# Patient Record
Sex: Male | Born: 1955 | Race: White | Hispanic: No | Marital: Married | Smoking: Current every day smoker
Health system: Southern US, Community
[De-identification: ages and names within clinical notes are randomized; demographics above are authoritative.]

## PROBLEM LIST (undated history)

## (undated) DIAGNOSIS — E119 Type 2 diabetes mellitus without complications: Secondary | ICD-10-CM

## (undated) DIAGNOSIS — I10 Essential (primary) hypertension: Secondary | ICD-10-CM

## (undated) DIAGNOSIS — I4891 Unspecified atrial fibrillation: Secondary | ICD-10-CM

## (undated) DIAGNOSIS — M1990 Unspecified osteoarthritis, unspecified site: Secondary | ICD-10-CM

## (undated) HISTORY — PX: APPENDECTOMY: SHX54

---

## 1997-05-06 DIAGNOSIS — I4891 Unspecified atrial fibrillation: Secondary | ICD-10-CM

## 1997-05-06 HISTORY — DX: Unspecified atrial fibrillation: I48.91

## 2002-05-06 HISTORY — PX: TIBIA FRACTURE SURGERY: SHX806

## 2002-05-06 HISTORY — PX: HIP FRACTURE SURGERY: SHX118

## 2011-05-07 HISTORY — PX: OTHER SURGICAL HISTORY: SHX169

## 2013-10-06 ENCOUNTER — Other Ambulatory Visit: Payer: Self-pay | Admitting: Podiatry

## 2013-10-07 NOTE — Addendum Note (Signed)
Addended by: Ferman Hamming on: 10/07/2013 09:08 AM   Modules accepted: Orders

## 2013-10-18 ENCOUNTER — Encounter (HOSPITAL_COMMUNITY): Payer: Self-pay

## 2013-10-19 NOTE — Patient Instructions (Addendum)
Willie Glover  10/19/2013   Your procedure is scheduled on:  10/28/13  [facility] to [facility] at 06:15 AM.  Call this number if you have problems the morning of surgery: [phone]   Remember:   Do not eat food or drink liquids after midnight.   Take these medicines the morning of surgery with A SIP OF WATER: Lisinopril, Digoxin, Carvedilol and Gabapentin. You may take your Hydrocodone if needed.   Stop Coumadin on 10/21/2013  Start  Aspirin 81 mg as soon as you stop the  Coumadin.  Start back on Coumadin the day after surgery.   Do not wear jewelry, make-up or nail polish.  Do not wear lotions, powders, or perfumes. You may wear deodorant.  Do not shave 48 hours prior to surgery. Men may shave face and neck.  Do not bring valuables to the hospital.  [facility] not [facility] belongings or valuables.               Contacts, dentures or bridgework may not be worn into surgery.  Leave suitcase in the car. After surgery it may be brought to your room.  For patients admitted to the hospital, discharge time is determined by your treatment team.               Patients discharged the day of surgery will not be allowed to drive home.   Special Instructions: Shower using CHG 1 nights before surgery and the morning of surgery.    Please read over the following fact sheets that you were given: Anesthesia Post-op Instructions    PATIENT INSTRUCTIONS POST-ANESTHESIA  IMMEDIATELY FOLLOWING SURGERY:  Do not drive or operate machinery for the first twenty four hours after surgery.  Do not make any important decisions for twenty four hours after surgery or while taking narcotic pain medications or sedatives.  If you develop intractable nausea and vomiting or a severe headache please notify your doctor immediately.  FOLLOW-UP:  Please make an appointment with your surgeon as instructed. You do not need to follow up with anesthesia unless specifically instructed to do so.  WOUND CARE  INSTRUCTIONS (if applicable):  Keep a dry clean dressing on the anesthesia/puncture wound site if there is drainage.  Once the wound has quit draining you may leave it open to air.  Generally you should leave the bandage intact for twenty four hours unless there is drainage.  If the epidural site drains for more than 36-48 hours please call the anesthesia department.  QUESTIONS?:  Please feel free to call your physician or the hospital operator if you have any questions, and they will be happy to assist you.

## 2013-10-20 ENCOUNTER — Ambulatory Visit (HOSPITAL_COMMUNITY)
Admission: RE | Admit: 2013-10-20 | Discharge: 2013-10-20 | Disposition: A | Discharge status: Home / Self Care | Payer: Medicare Other | Source: Ambulatory Visit | Attending: Podiatry | Admitting: Podiatry

## 2013-10-20 ENCOUNTER — Encounter (HOSPITAL_COMMUNITY): Payer: Self-pay

## 2013-10-20 ENCOUNTER — Other Ambulatory Visit: Payer: Self-pay

## 2013-10-20 ENCOUNTER — Encounter (HOSPITAL_COMMUNITY)
Admission: RE | Admit: 2013-10-20 | Discharge: 2013-10-20 | Disposition: A | Discharge status: Home / Self Care | Payer: Medicare Other | Source: Ambulatory Visit | Attending: Podiatry | Admitting: Podiatry

## 2013-10-20 DIAGNOSIS — M2010 Hallux valgus (acquired), unspecified foot: Secondary | ICD-10-CM | POA: Insufficient documentation

## 2013-10-20 DIAGNOSIS — M7989 Other specified soft tissue disorders: Secondary | ICD-10-CM | POA: Insufficient documentation

## 2013-10-20 HISTORY — DX: Unspecified osteoarthritis, unspecified site: M19.90

## 2013-10-20 HISTORY — DX: Unspecified atrial fibrillation: I48.91

## 2013-10-20 HISTORY — DX: Essential (primary) hypertension: I10

## 2013-10-20 HISTORY — DX: Type 2 diabetes mellitus without complications: E11.9

## 2013-10-20 LAB — BASIC METABOLIC PANEL
BUN: 9 mg/dL (ref 6–23)
CHLORIDE: 104 meq/L (ref 96–112)
CO2: 25 meq/L (ref 19–32)
Calcium: 10 mg/dL (ref 8.4–10.5)
Creatinine, Ser: 0.75 mg/dL (ref 0.50–1.35)
GFR calc Af Amer: >90 mL/min (ref >90)
GFR calc non Af Amer: >90 mL/min (ref >90)
Glucose, Bld: 104 mg/dL — ABNORMAL HIGH (ref 70–99)
POTASSIUM: 4.9 meq/L (ref 3.7–5.3)
SODIUM: 141 meq/L (ref 137–147)

## 2013-10-20 LAB — CBC
HCT: 46.8 % (ref 39.0–52.0)
Hemoglobin: 16.3 g/dL (ref 13.0–17.0)
MCH: 34.2 pg — ABNORMAL HIGH (ref 26.0–34.0)
MCHC: 34.8 g/dL (ref 30.0–36.0)
MCV: 98.3 fL (ref 78.0–100.0)
PLATELETS: 222 10*3/uL (ref 150–400)
RBC: 4.76 MIL/uL (ref 4.22–5.81)
RDW: 14.1 % (ref 11.5–15.5)
WBC: 5.4 10*3/uL (ref 4.0–10.5)

## 2013-10-28 ENCOUNTER — Ambulatory Visit (HOSPITAL_COMMUNITY)
Admission: RE | Admit: 2013-10-28 | Discharge: 2013-10-28 | Disposition: A | Discharge status: Home / Self Care | Payer: Medicare Other | Source: Ambulatory Visit | Attending: Podiatry | Admitting: Podiatry

## 2013-10-28 ENCOUNTER — Ambulatory Visit (HOSPITAL_COMMUNITY): Payer: Medicare Other

## 2013-10-28 ENCOUNTER — Encounter (HOSPITAL_COMMUNITY)
Admission: RE | Disposition: A | Discharge status: Home / Self Care | Payer: Self-pay | Source: Ambulatory Visit | Attending: Podiatry

## 2013-10-28 ENCOUNTER — Ambulatory Visit (HOSPITAL_COMMUNITY): Payer: Medicare Other | Admitting: Anesthesiology

## 2013-10-28 ENCOUNTER — Encounter (HOSPITAL_COMMUNITY): Payer: Self-pay | Admitting: *Deleted

## 2013-10-28 ENCOUNTER — Encounter (HOSPITAL_COMMUNITY): Payer: Medicare Other | Admitting: Anesthesiology

## 2013-10-28 DIAGNOSIS — F17200 Nicotine dependence, unspecified, uncomplicated: Secondary | ICD-10-CM | POA: Insufficient documentation

## 2013-10-28 DIAGNOSIS — Z79899 Other long term (current) drug therapy: Secondary | ICD-10-CM | POA: Insufficient documentation

## 2013-10-28 DIAGNOSIS — I4891 Unspecified atrial fibrillation: Secondary | ICD-10-CM | POA: Insufficient documentation

## 2013-10-28 DIAGNOSIS — L97509 Non-pressure chronic ulcer of other part of unspecified foot with unspecified severity: Secondary | ICD-10-CM | POA: Insufficient documentation

## 2013-10-28 DIAGNOSIS — E1149 Type 2 diabetes mellitus with other diabetic neurological complication: Secondary | ICD-10-CM | POA: Insufficient documentation

## 2013-10-28 DIAGNOSIS — I10 Essential (primary) hypertension: Secondary | ICD-10-CM | POA: Insufficient documentation

## 2013-10-28 DIAGNOSIS — M86179 Other acute osteomyelitis, unspecified ankle and foot: Secondary | ICD-10-CM | POA: Insufficient documentation

## 2013-10-28 DIAGNOSIS — L97522 Non-pressure chronic ulcer of other part of left foot with fat layer exposed: Secondary | ICD-10-CM

## 2013-10-28 DIAGNOSIS — Z7901 Long term (current) use of anticoagulants: Secondary | ICD-10-CM | POA: Insufficient documentation

## 2013-10-28 DIAGNOSIS — E1142 Type 2 diabetes mellitus with diabetic polyneuropathy: Secondary | ICD-10-CM | POA: Insufficient documentation

## 2013-10-28 HISTORY — PX: OSTECTOMY: SHX6439

## 2013-10-28 LAB — GLUCOSE, CAPILLARY
GLUCOSE-CAPILLARY: 134 mg/dL — AB (ref 70–99)
Glucose-Capillary: 107 mg/dL — ABNORMAL HIGH (ref 70–99)

## 2013-10-28 SURGERY — OSTECTOMY
Anesthesia: Monitor Anesthesia Care | Site: Foot | Laterality: Left

## 2013-10-28 MED ORDER — BUPIVACAINE HCL (PF) 0.5 % IJ SOLN
INTRAMUSCULAR | Status: AC
  Filled 2013-10-28: qty 30

## 2013-10-28 MED ORDER — FENTANYL CITRATE 0.05 MG/ML IJ SOLN: 25.0000 ug | INTRAMUSCULAR | Status: DC | PRN

## 2013-10-28 MED ORDER — FENTANYL CITRATE 0.05 MG/ML IJ SOLN
25.0000 ug | INTRAMUSCULAR | Status: AC
  Administered 2013-10-28 (×2): 25 ug via INTRAVENOUS

## 2013-10-28 MED ORDER — PROPOFOL 10 MG/ML IV EMUL
INTRAVENOUS | Status: AC
  Filled 2013-10-28: qty 20

## 2013-10-28 MED ORDER — SODIUM CHLORIDE 0.9 % IR SOLN
Status: DC | PRN
  Administered 2013-10-28: 1000 mL

## 2013-10-28 MED ORDER — ONDANSETRON HCL 4 MG/2ML IJ SOLN: 4.0000 mg | Freq: Once | INTRAMUSCULAR | Status: DC | PRN

## 2013-10-28 MED ORDER — MIDAZOLAM HCL 5 MG/5ML IJ SOLN
INTRAMUSCULAR | Status: DC | PRN
  Administered 2013-10-28: 2 mg via INTRAVENOUS

## 2013-10-28 MED ORDER — PROPOFOL INFUSION 10 MG/ML OPTIME
INTRAVENOUS | Status: DC | PRN
  Administered 2013-10-28: 50 ug/kg/min via INTRAVENOUS
  Administered 2013-10-28: 08:00:00 via INTRAVENOUS

## 2013-10-28 MED ORDER — MIDAZOLAM HCL 2 MG/2ML IJ SOLN
INTRAMUSCULAR | Status: AC
  Filled 2013-10-28: qty 2

## 2013-10-28 MED ORDER — FENTANYL CITRATE 0.05 MG/ML IJ SOLN
INTRAMUSCULAR | Status: AC
Start: 2013-10-28 — End: 2013-10-28
  Filled 2013-10-28: qty 2

## 2013-10-28 MED ORDER — BUPIVACAINE HCL (PF) 0.5 % IJ SOLN
INTRAMUSCULAR | Status: DC | PRN
  Administered 2013-10-28: 20 mL

## 2013-10-28 MED ORDER — FENTANYL CITRATE 0.05 MG/ML IJ SOLN
INTRAMUSCULAR | Status: AC
  Filled 2013-10-28: qty 2

## 2013-10-28 MED ORDER — CEFAZOLIN SODIUM-DEXTROSE 2-3 GM-% IV SOLR
INTRAVENOUS | Status: AC
  Filled 2013-10-28: qty 50

## 2013-10-28 MED ORDER — LACTATED RINGERS IV SOLN
INTRAVENOUS | Status: DC
  Administered 2013-10-28: 1000 mL via INTRAVENOUS

## 2013-10-28 MED ORDER — CEFAZOLIN SODIUM-DEXTROSE 2-3 GM-% IV SOLR
2.0000 g | INTRAVENOUS | Status: AC
  Administered 2013-10-28: 2 g via INTRAVENOUS

## 2013-10-28 MED ORDER — SEVOFLURANE IN SOLN
RESPIRATORY_TRACT | Status: AC
  Filled 2013-10-28: qty 250

## 2013-10-28 MED ORDER — ONDANSETRON HCL 4 MG/2ML IJ SOLN
INTRAMUSCULAR | Status: AC
  Filled 2013-10-28: qty 2

## 2013-10-28 MED ORDER — MIDAZOLAM HCL 2 MG/2ML IJ SOLN
1.0000 mg | INTRAMUSCULAR | Status: DC | PRN
  Administered 2013-10-28: 2 mg via INTRAVENOUS

## 2013-10-28 MED ORDER — ONDANSETRON HCL 4 MG/2ML IJ SOLN
4.0000 mg | Freq: Once | INTRAMUSCULAR | Status: AC
  Administered 2013-10-28: 4 mg via INTRAVENOUS

## 2013-10-28 MED ORDER — FENTANYL CITRATE 0.05 MG/ML IJ SOLN
INTRAMUSCULAR | Status: DC | PRN
  Administered 2013-10-28 (×2): 12.5 ug via INTRAVENOUS
  Administered 2013-10-28: 25 ug via INTRAVENOUS

## 2013-10-28 MED ORDER — LIDOCAINE HCL (PF) 1 % IJ SOLN
INTRAMUSCULAR | Status: AC
  Filled 2013-10-28: qty 30

## 2013-10-28 SURGICAL SUPPLY — 58 items
BAG HAMPER (MISCELLANEOUS) ×3 IMPLANT
BANDAGE ELASTIC 4 VELCRO NS (GAUZE/BANDAGES/DRESSINGS) ×3 IMPLANT
BANDAGE ESMARK 4X12 BL STRL LF (DISPOSABLE) ×1 IMPLANT
BANDAGE GAUZE ELAST BULKY 4 IN (GAUZE/BANDAGES/DRESSINGS) ×3 IMPLANT
BENZOIN TINCTURE PRP APPL 2/3 (GAUZE/BANDAGES/DRESSINGS) ×3 IMPLANT
BLADE 15 SAFETY STRL DISP (BLADE) ×6 IMPLANT
BLADE AVERAGE 25MMX9MM (BLADE) ×1
BLADE AVERAGE 25X9 (BLADE) ×2 IMPLANT
BNDG CONFORM 2 STRL LF (GAUZE/BANDAGES/DRESSINGS) IMPLANT
BNDG ESMARK 4X12 BLUE STRL LF (DISPOSABLE) ×3
BNDG GAUZE ELAST 4 BULKY (GAUZE/BANDAGES/DRESSINGS) IMPLANT
CHLORAPREP W/TINT 26ML (MISCELLANEOUS) IMPLANT
CLOSURE WOUND 1/2 X4 (GAUZE/BANDAGES/DRESSINGS) ×2
CLOTH BEACON ORANGE TIMEOUT ST (SAFETY) ×3 IMPLANT
COVER LIGHT HANDLE STERIS (MISCELLANEOUS) ×6 IMPLANT
COVER MAYO STAND XLG (DRAPE) ×3 IMPLANT
CUFF TOURNIQUET SINGLE 18IN (TOURNIQUET CUFF) ×3 IMPLANT
DECANTER SPIKE VIAL GLASS SM (MISCELLANEOUS) ×3 IMPLANT
DRAPE OEC MINIVIEW 54X84 (DRAPES) ×3 IMPLANT
DRSG ADAPTIC 3X8 NADH LF (GAUZE/BANDAGES/DRESSINGS) ×3 IMPLANT
DURA STEPPER LG (CAST SUPPLIES) ×3 IMPLANT
DURA STEPPER MED (CAST SUPPLIES) IMPLANT
DURA STEPPER SML (CAST SUPPLIES) IMPLANT
DURA STEPPER XL (SOFTGOODS) IMPLANT
ELECT REM PT RETURN 9FT ADLT (ELECTROSURGICAL) ×3
ELECTRODE REM PT RTRN 9FT ADLT (ELECTROSURGICAL) ×1 IMPLANT
GAUZE SPONGE 4X4 12PLY STRL (GAUZE/BANDAGES/DRESSINGS) ×2 IMPLANT
GLOVE BIO SURGEON STRL SZ7.5 (GLOVE) ×6 IMPLANT
GLOVE BIOGEL PI IND STRL 7.0 (GLOVE) ×2 IMPLANT
GLOVE BIOGEL PI IND STRL 7.5 (GLOVE) ×1 IMPLANT
GLOVE BIOGEL PI IND STRL 8 (GLOVE) ×1 IMPLANT
GLOVE BIOGEL PI IND STRL 8.5 (GLOVE) ×1 IMPLANT
GLOVE BIOGEL PI INDICATOR 7.0 (GLOVE) ×4
GLOVE BIOGEL PI INDICATOR 7.5 (GLOVE) ×2
GLOVE BIOGEL PI INDICATOR 8 (GLOVE) ×2
GLOVE BIOGEL PI INDICATOR 8.5 (GLOVE) ×2
GLOVE ECLIPSE 6.5 STRL STRAW (GLOVE) ×3 IMPLANT
GLOVE ECLIPSE 7.0 STRL STRAW (GLOVE) ×3 IMPLANT
GLOVE ECLIPSE 8.5 STRL (GLOVE) ×3 IMPLANT
GOWN STRL REUS W/TWL LRG LVL3 (GOWN DISPOSABLE) ×9 IMPLANT
KIT ROOM TURNOVER AP CYSTO (KITS) ×3 IMPLANT
MANIFOLD NEPTUNE II (INSTRUMENTS) ×3 IMPLANT
NEEDLE HYPO 27GX1-1/4 (NEEDLE) ×6 IMPLANT
NS IRRIG 1000ML POUR BTL (IV SOLUTION) ×3 IMPLANT
PACK BASIC LIMB (CUSTOM PROCEDURE TRAY) ×3 IMPLANT
PAD ARMBOARD 7.5X6 YLW CONV (MISCELLANEOUS) ×3 IMPLANT
PADDING WEBRIL 2 STERILE (GAUZE/BANDAGES/DRESSINGS) ×3 IMPLANT
RASP SM TEAR CROSS CUT (RASP) ×3 IMPLANT
SET BASIN LINEN APH (SET/KITS/TRAYS/PACK) ×3 IMPLANT
SPONGE GAUZE 4X4 12PLY (GAUZE/BANDAGES/DRESSINGS) ×3 IMPLANT
SPONGE LAP 18X18 X RAY DECT (DISPOSABLE) ×3 IMPLANT
STRIP CLOSURE SKIN 1/2X4 (GAUZE/BANDAGES/DRESSINGS) ×4 IMPLANT
SUT PROLENE 3 0 PS 2 (SUTURE) ×3 IMPLANT
SUT PROLENE 4 0 PS 2 18 (SUTURE) ×3 IMPLANT
SUT VIC AB 2-0 CT2 27 (SUTURE) IMPLANT
SUT VIC AB 4-0 PS2 27 (SUTURE) ×3 IMPLANT
SUT VICRYL AB 3-0 FS1 BRD 27IN (SUTURE) IMPLANT
SYR CONTROL 10ML LL (SYRINGE) ×6 IMPLANT

## 2013-10-28 NOTE — Anesthesia Postprocedure Evaluation (Signed)
  Anesthesia Post-op Note  Patient: Willie Glover  Procedure(s) Performed: Procedure(s): OSTECTOMY LEFT FOOT (Left)  Patient Location: PACU  Anesthesia Type:MAC  Level of Consciousness: awake, alert , oriented and patient cooperative  Airway and Oxygen Therapy: Patient Spontanous Breathing  Post-op Pain: 2 /10, mild  Post-op Assessment: Post-op Vital signs reviewed, Patient's Cardiovascular Status Stable, Respiratory Function Stable, Patent Airway and Pain level controlled  Post-op Vital Signs: Reviewed and stable  Last Vitals:  Filed Vitals:   10/28/13 0725  BP: 136/59  Temp:   Resp: 23    Complications: No apparent anesthesia complications

## 2013-10-28 NOTE — Transfer of Care (Signed)
Immediate Anesthesia Transfer of Care Note  Patient: Willie Glover  Procedure(s) Performed: Procedure(s): OSTECTOMY LEFT FOOT (Left)  Patient Location: PACU  Anesthesia Type:MAC  Level of Consciousness: awake, alert , oriented and patient cooperative  Airway & Oxygen Therapy: Patient Spontanous Breathing  Post-op Assessment: Report given to PACU RN, Post -op Vital signs reviewed and stable and Patient moving all extremities  Post vital signs: Reviewed and stable  Complications: No apparent anesthesia complications

## 2013-10-28 NOTE — Op Note (Signed)
OPERATIVE NOTE  DATE OF PROCEDURE:  10/28/2013  SURGEON:   Dallas SchimkeBenjamin Ivan McKinney, DPM  OR STAFF:   Circulator: Horton MarshallKatie S Woods, RN Scrub Person: Hurshel PartyAngela Maria Witt, CST RN First Assistant: Eliane Decreeatherine Jann Page, RN   PREOPERATIVE DIAGNOSIS:   1.  Chronic ulcerations, left foot (ICD-9 707.15) 2.  Diabetes mellitus with peripheral neuropathy (ICD-9 250.60) 3.  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  DESCRIPTION OF THE PROCEDURE:   The patient was brought to the operating room and placed on the operative table in the supine position.  A pneumatic ankle tourniquet was applied to the patient's ankle.  Following sedation, the surgical site was anesthetized with 0.5% Marcaine plain.  The foot was then prepped, scrubbed, and draped in the usual sterile technique.  The foot was elevated, exsanguinated and the pneumatic ankle tourniquet inflated to 250 mmHg.    2 full-thickness ulcerations are present along the medial plantar aspect of the left foot.  The ulcerations are comprised of mixed fibrotic and granular tissue.  No acute signs of infection are present.  Attention was directed to the medial aspect of the left foot.  A linear longitudinal incision was made extending from the base of the left hallux proximally to the midfoot.  Dissection was continued deep down to the level of the osseous structures.  All bleeders were cauterized.  The plantar prominence underlying both ulcerations were identified.  The  plantar prominence of the midfoot, first metatarsal bone and base of the proximal phalanx were resected using a bone saw.  All edges were smoothed with a bone rasp.  The surgical field was irrigated with copious amounts of sterile irrigant.  The subcutaneous structures were reapproximated using 4-0 Vicryl.  The skin was reapproximated using 4-0 Vicryl in a running subcuticular manner.  The incision closure was reinforced with Prolene.  Steri-Strips were applied.  A dressing was applied to the left foot.  The pneumatic ankle tourniquet was deflated and a prompt hyperemic response was noted to all digits of the left foot.   The patient tolerated the procedure well.  The patient was then transferred to PACU with vital signs stable and vascular status intact to all toes of the operative foot.  Following a period of postoperative monitoring, the patient will be discharged home.

## 2013-10-28 NOTE — H&P (Signed)
HISTORY AND PHYSICAL INTERVAL NOTE:  10/28/2013  7:18 AM  Willie Glover  has presented today for surgery, with the diagnosis of ulceration left foot, diabetes mellitus with pain, charcot arthropathy.  The various methods of treatment have been discussed with the patient.  No guarantees were given.  After consideration of risks, benefits and other options for treatment, the patient has consented to surgery.  I have reviewed the patients' chart and labs.    Patient Vitals for the past 24 hrs:  BP Temp Temp src Resp SpO2  10/28/13 0709 128/83 mmHg 98.3 F (36.8 C) Oral 22 94 %    A history and physical examination was performed in my office.  The patient was reexamined.  There have been no changes to this history and physical examination.  Dallas SchimkeBenjamin Ivan McKinney, DPM

## 2013-10-28 NOTE — Anesthesia Preprocedure Evaluation (Signed)
Anesthesia Evaluation  Patient identified by MRN, date of birth, ID band Patient awake    Reviewed: Allergy & Precautions, H&P , NPO status , Patient's Chart, lab work & pertinent test results, reviewed documented beta blocker date and time   Airway Mallampati: III TM Distance: >3 FB Neck ROM: Full    Dental  (+) Edentulous Upper, Edentulous Lower   Pulmonary Current Smoker,  breath sounds clear to auscultation        Cardiovascular hypertension, Pt. on medications and Pt. on home beta blockers Rhythm:Regular Rate:Normal     Neuro/Psych    GI/Hepatic negative GI ROS,   Endo/Other  diabetes, Well Controlled, Type 2, Oral Hypoglycemic Agents  Renal/GU      Musculoskeletal   Abdominal   Peds  Hematology   Anesthesia Other Findings   Reproductive/Obstetrics                           Anesthesia Physical Anesthesia Plan  ASA: III  Anesthesia Plan: MAC   Post-op Pain Management:    Induction: Intravenous  Airway Management Planned: Nasal Cannula  Additional Equipment:   Intra-op Plan:   Post-operative Plan:   Informed Consent: I have reviewed the patients History and Physical, chart, labs and discussed the procedure including the risks, benefits and alternatives for the proposed anesthesia with the patient or authorized representative who has indicated his/her understanding and acceptance.     Plan Discussed with:   Anesthesia Plan Comments:         Anesthesia Quick Evaluation

## 2013-10-29 ENCOUNTER — Encounter (HOSPITAL_COMMUNITY): Payer: Self-pay | Admitting: Podiatry

## 2015-06-14 IMAGING — CR DG FOOT COMPLETE 3+V*L*
3 series · 3 of 3 positions shown · non-contrast
Comparison: 12/27/2009

CLINICAL DATA: Preop for ulceration of the foot.

EXAM:
LEFT FOOT - COMPLETE 3+ VIEW

[view not recorded (1 of 3)]
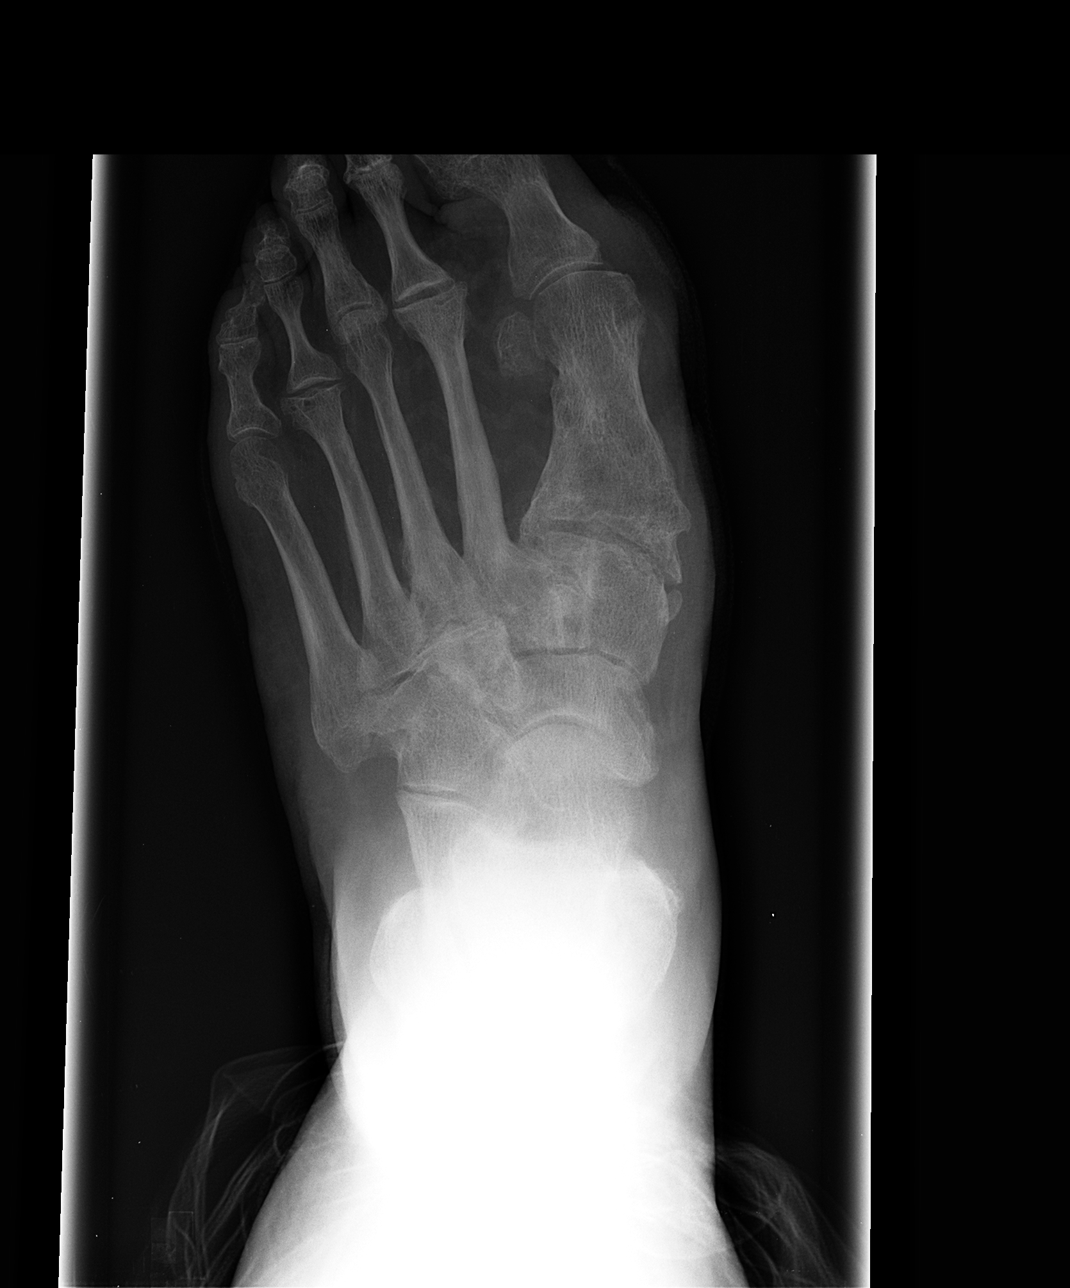

[view not recorded (2 of 3)]
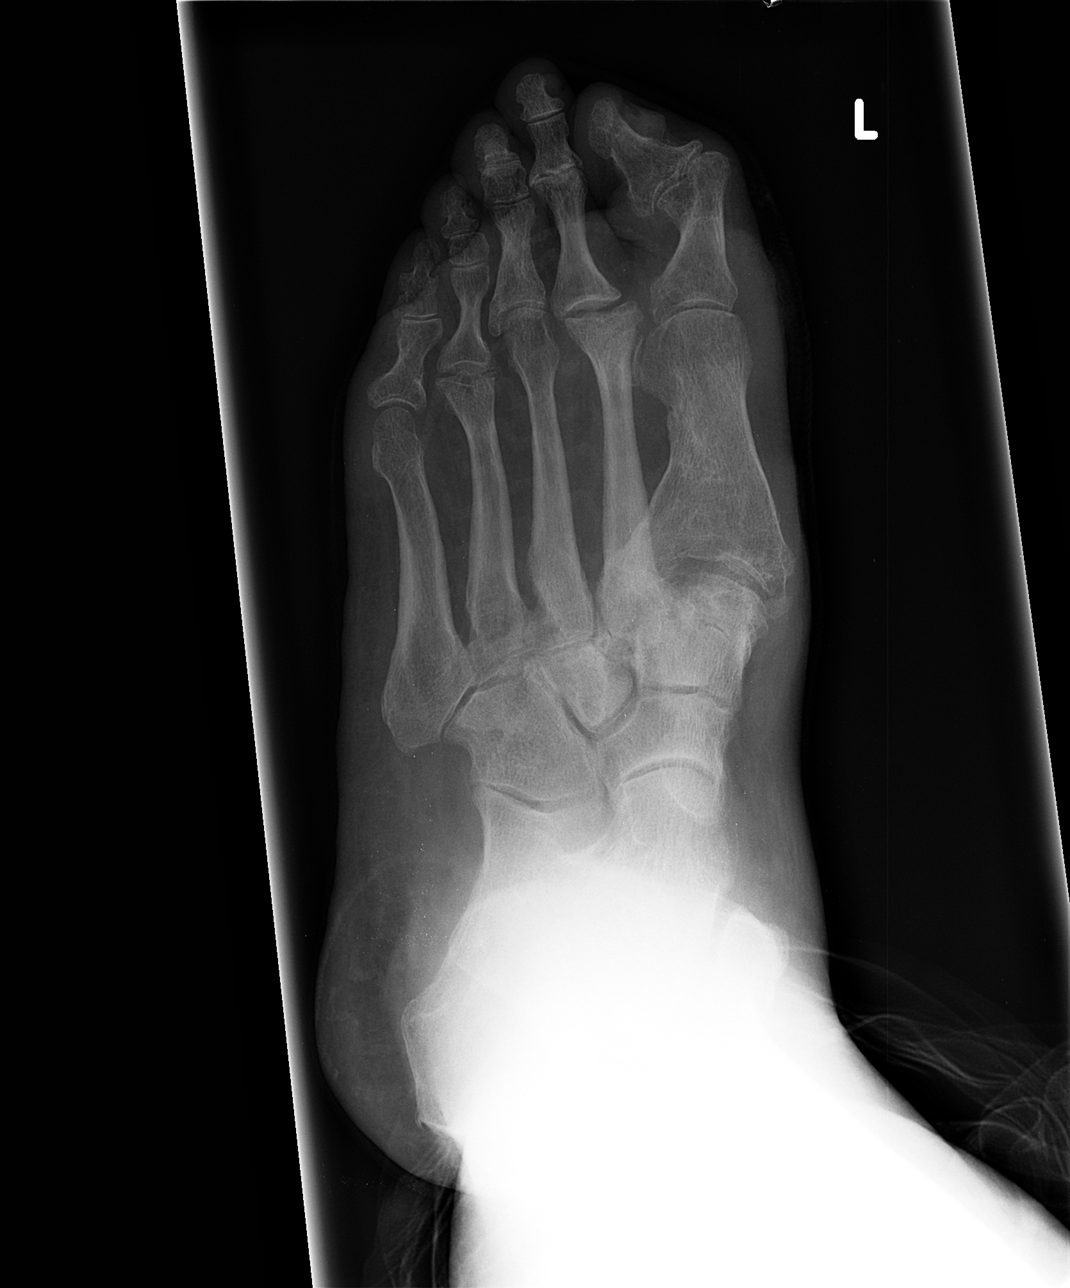

[view not recorded (3 of 3)]
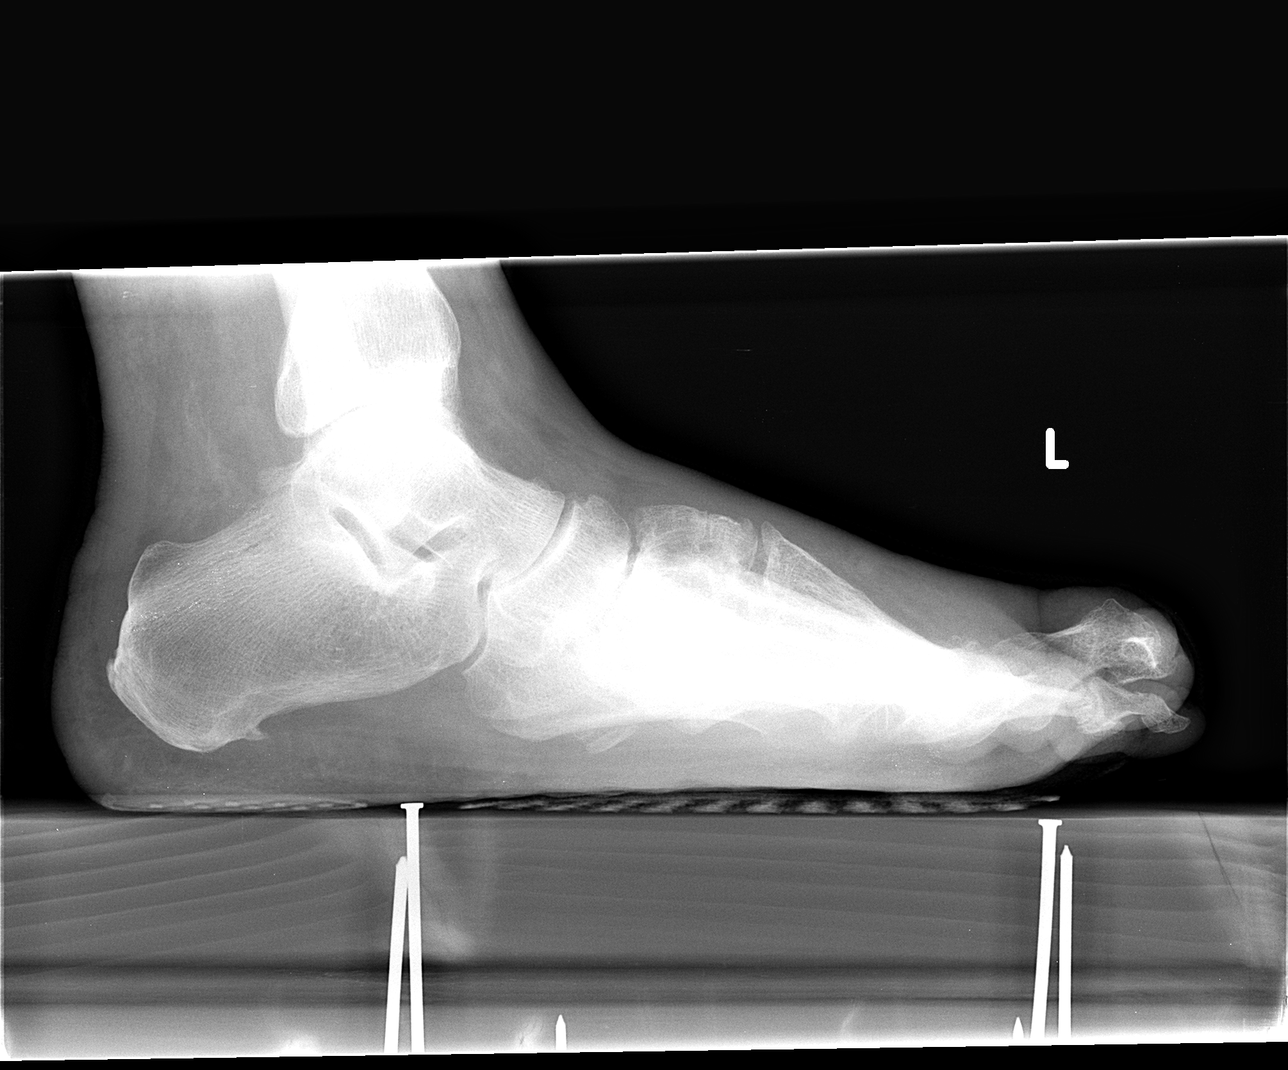

[3 of 3 positions shown; findings below may reference images not displayed]

FINDINGS: Postoperative or posttraumatic changes are identified involving the
first metatarsal. Hallux valgus deformity is present. There is
diffuse soft tissue swelling involving the foot. No radiopaque
foreign body or soft tissue gas identified. No acute fracture.
IMPRESSION: 1. Postsurgical or post traumatic changes involving the first
metatarsal.
2. Diffuse soft tissue swelling.
3. No acute fracture.

## 2015-06-22 IMAGING — CR DG FOOT COMPLETE 3+V*L*
3 series · 3 of 3 positions shown · non-contrast
Comparison: 10/20/2013

CLINICAL DATA: Ulceration.  Status post osteotomy.

EXAM:
LEFT FOOT - COMPLETE 3+ VIEW

[ap]
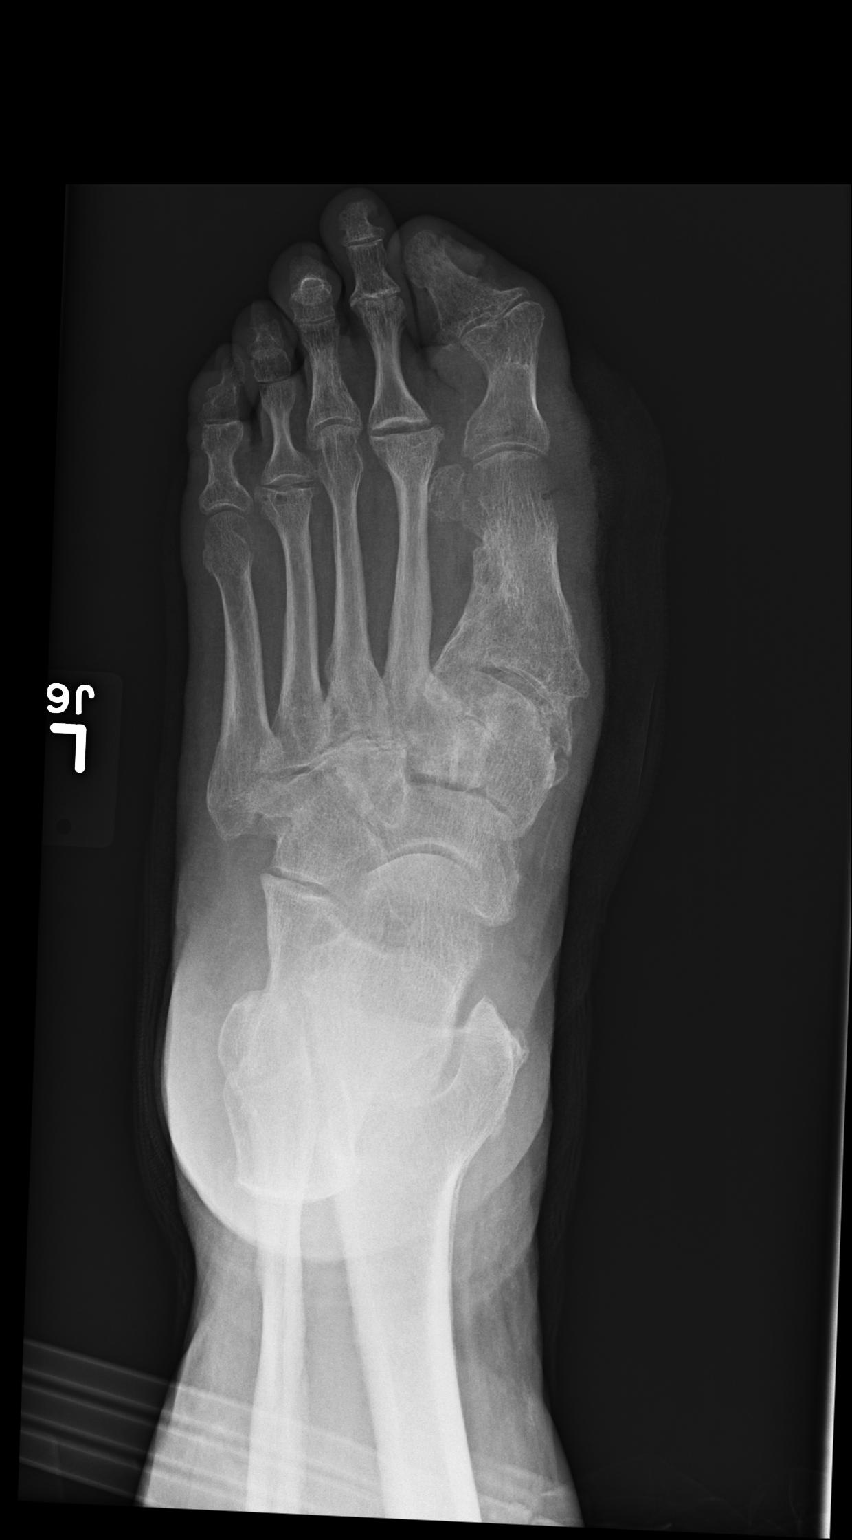

[oblique]
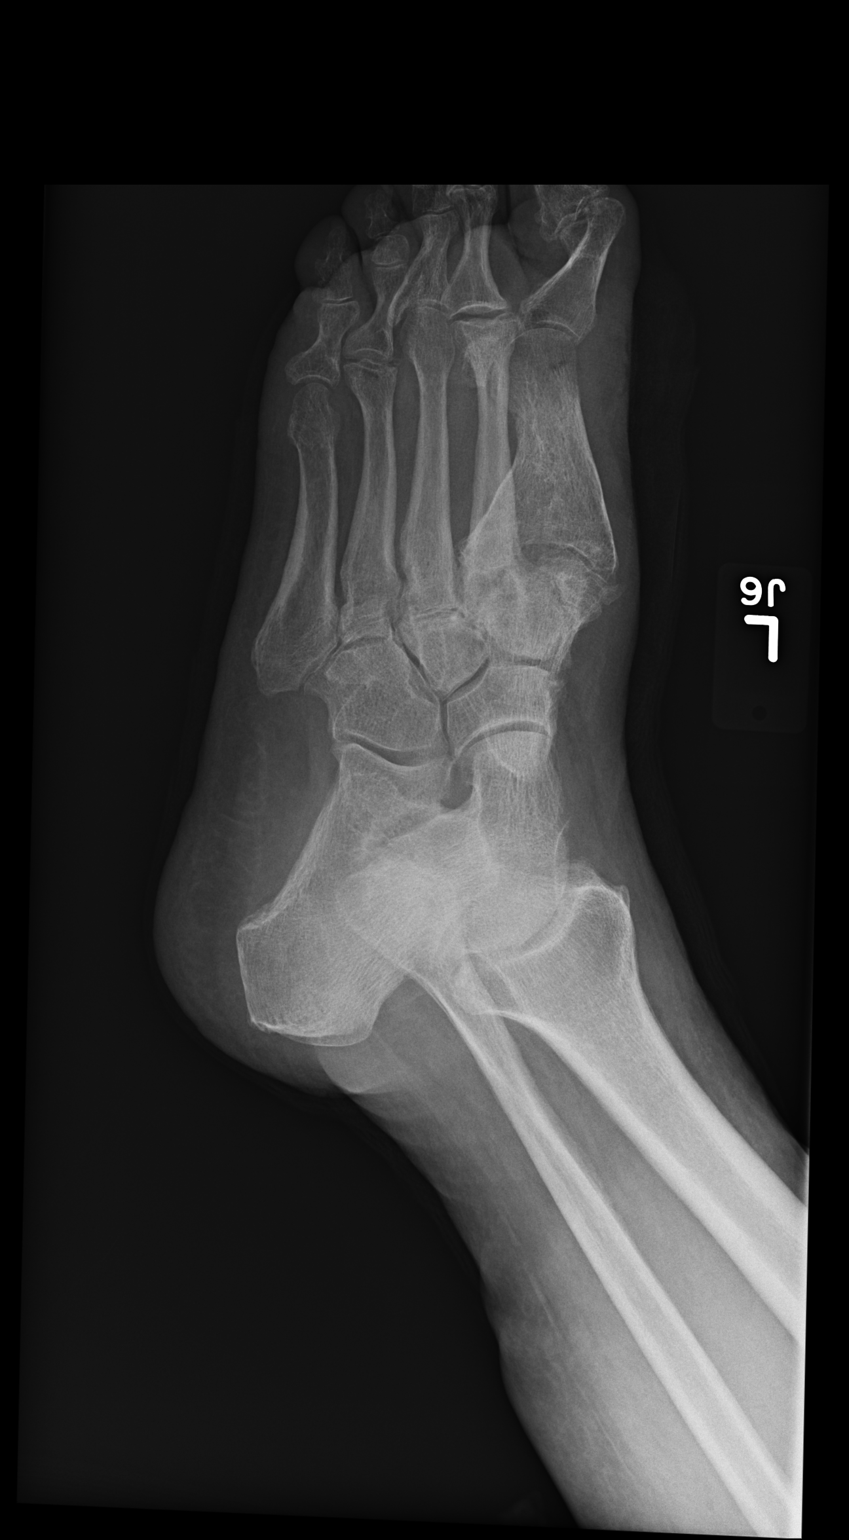

[lat]
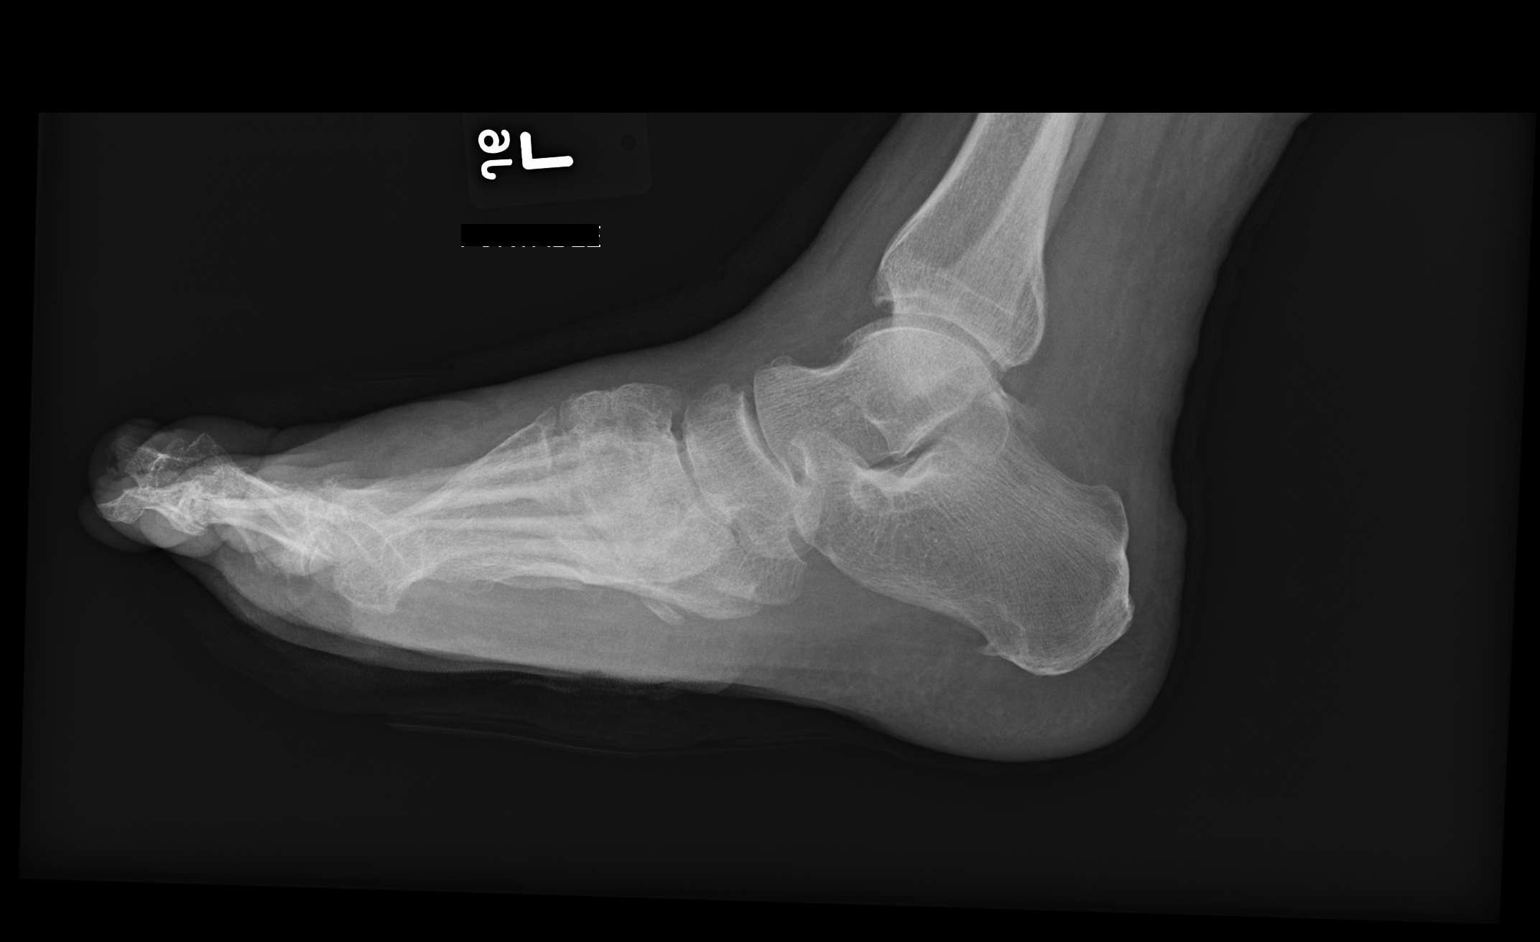

[3 of 3 positions shown; findings below may reference images not displayed]

FINDINGS: Deformity of the foot is stable. Disorganization of Lisfranc joint
is noted. Flattening of the heads of the second and fourth
metatarsals is stable. Sclerosis along the diaphysis of the second
metatarsal is stable. Soft tissue swelling over the dorsum of the
foot is present. Bone has been removed from the medial aspect of the
first metatarsal head.
IMPRESSION: Postoperative changes involving the first metatarsal. Otherwise
stable exam.

## 2022-07-05 DEATH — deceased
